# Patient Record
Sex: Male | Born: 1935 | Race: White | Hispanic: No | Marital: Married
Health system: Southern US, Community
[De-identification: ages and names within clinical notes are randomized; demographics above are authoritative.]

---

## 2006-02-18 ENCOUNTER — Emergency Department: Payer: Self-pay | Admitting: Unknown Physician Specialty

## 2006-02-23 ENCOUNTER — Inpatient Hospital Stay: Payer: Self-pay | Admitting: Internal Medicine

## 2006-02-23 ENCOUNTER — Other Ambulatory Visit: Payer: Self-pay

## 2006-03-03 ENCOUNTER — Ambulatory Visit: Payer: Self-pay | Admitting: Internal Medicine

## 2006-04-03 ENCOUNTER — Ambulatory Visit: Payer: Self-pay | Admitting: Internal Medicine

## 2007-05-03 ENCOUNTER — Ambulatory Visit: Payer: Self-pay | Admitting: Specialist

## 2007-07-23 ENCOUNTER — Ambulatory Visit: Payer: Self-pay | Admitting: General Surgery

## 2007-08-11 ENCOUNTER — Ambulatory Visit: Payer: Self-pay | Admitting: Specialist

## 2007-08-23 ENCOUNTER — Ambulatory Visit: Payer: Self-pay | Admitting: Specialist

## 2009-03-03 ENCOUNTER — Ambulatory Visit: Payer: Self-pay | Admitting: Internal Medicine

## 2009-03-06 ENCOUNTER — Ambulatory Visit: Payer: Self-pay | Admitting: Internal Medicine

## 2009-03-27 ENCOUNTER — Ambulatory Visit: Payer: Self-pay | Admitting: Specialist

## 2009-04-03 ENCOUNTER — Ambulatory Visit: Payer: Self-pay | Admitting: Internal Medicine

## 2009-05-03 ENCOUNTER — Ambulatory Visit: Payer: Self-pay | Admitting: Internal Medicine

## 2009-06-03 ENCOUNTER — Ambulatory Visit: Payer: Self-pay | Admitting: Internal Medicine

## 2009-08-03 ENCOUNTER — Ambulatory Visit: Payer: Self-pay | Admitting: Internal Medicine

## 2009-08-06 ENCOUNTER — Ambulatory Visit: Payer: Self-pay | Admitting: Internal Medicine

## 2009-08-29 ENCOUNTER — Ambulatory Visit: Payer: Self-pay | Admitting: Podiatry

## 2009-08-31 ENCOUNTER — Ambulatory Visit: Payer: Self-pay | Admitting: Podiatry

## 2009-09-03 ENCOUNTER — Ambulatory Visit: Payer: Self-pay | Admitting: Internal Medicine

## 2009-10-03 ENCOUNTER — Ambulatory Visit: Payer: Self-pay | Admitting: Internal Medicine

## 2009-11-27 ENCOUNTER — Ambulatory Visit: Payer: Self-pay | Admitting: Internal Medicine

## 2009-12-04 ENCOUNTER — Ambulatory Visit: Payer: Self-pay | Admitting: Internal Medicine

## 2010-01-01 ENCOUNTER — Ambulatory Visit: Payer: Self-pay | Admitting: Internal Medicine

## 2010-01-21 ENCOUNTER — Ambulatory Visit: Payer: Self-pay | Admitting: Internal Medicine

## 2010-01-22 ENCOUNTER — Emergency Department: Payer: Self-pay | Admitting: Emergency Medicine

## 2010-02-01 ENCOUNTER — Ambulatory Visit: Payer: Self-pay | Admitting: Internal Medicine

## 2010-03-18 ENCOUNTER — Ambulatory Visit: Payer: Self-pay | Admitting: Internal Medicine

## 2010-04-03 ENCOUNTER — Ambulatory Visit: Payer: Self-pay | Admitting: Internal Medicine

## 2010-06-26 ENCOUNTER — Ambulatory Visit: Payer: Self-pay | Admitting: Podiatry

## 2010-06-28 ENCOUNTER — Ambulatory Visit: Payer: Self-pay | Admitting: Podiatry

## 2010-07-01 LAB — PATHOLOGY REPORT

## 2010-07-04 ENCOUNTER — Ambulatory Visit: Payer: Self-pay | Admitting: Internal Medicine

## 2010-07-19 ENCOUNTER — Ambulatory Visit: Payer: Self-pay | Admitting: Internal Medicine

## 2010-08-03 ENCOUNTER — Ambulatory Visit: Payer: Self-pay | Admitting: Internal Medicine

## 2010-11-12 ENCOUNTER — Inpatient Hospital Stay: Payer: Self-pay | Admitting: Internal Medicine

## 2011-04-11 ENCOUNTER — Encounter: Payer: Self-pay | Admitting: Nurse Practitioner

## 2011-05-04 ENCOUNTER — Encounter: Payer: Self-pay | Admitting: Nurse Practitioner

## 2011-06-04 ENCOUNTER — Encounter: Payer: Self-pay | Admitting: Nurse Practitioner

## 2011-07-03 ENCOUNTER — Ambulatory Visit: Payer: Self-pay | Admitting: Rheumatology

## 2011-07-05 ENCOUNTER — Encounter: Payer: Self-pay | Admitting: Nurse Practitioner

## 2011-07-10 ENCOUNTER — Ambulatory Visit: Payer: Self-pay | Admitting: Internal Medicine

## 2011-07-15 ENCOUNTER — Ambulatory Visit: Payer: Self-pay | Admitting: Internal Medicine

## 2011-07-16 ENCOUNTER — Encounter: Payer: Self-pay | Admitting: Rheumatology

## 2011-07-30 ENCOUNTER — Encounter: Payer: Self-pay | Admitting: Nurse Practitioner

## 2011-08-04 ENCOUNTER — Ambulatory Visit: Payer: Self-pay | Admitting: Internal Medicine

## 2011-08-04 ENCOUNTER — Encounter: Payer: Self-pay | Admitting: Nurse Practitioner

## 2011-08-07 ENCOUNTER — Ambulatory Visit: Payer: Self-pay | Admitting: Internal Medicine

## 2011-08-13 ENCOUNTER — Encounter: Payer: Self-pay | Admitting: Nurse Practitioner

## 2011-08-28 ENCOUNTER — Inpatient Hospital Stay: Payer: Self-pay | Admitting: Internal Medicine

## 2011-09-04 ENCOUNTER — Ambulatory Visit: Payer: Self-pay | Admitting: Internal Medicine

## 2011-09-05 ENCOUNTER — Ambulatory Visit: Payer: Self-pay | Admitting: Internal Medicine

## 2011-10-04 ENCOUNTER — Ambulatory Visit: Payer: Self-pay | Admitting: Internal Medicine

## 2011-10-04 DEATH — deceased

## 2012-06-09 IMAGING — CT NM PET TUM IMG RESTAG (PS) SKULL BASE T - THIGH
1 of 5 series · 2 of 25 positions shown · non-contrast
Comparison: none

REASON FOR EXAM: lymphoma restaging
COMMENTS:

[Series 3: ct headneck 2.0 b31s · axial · 2.0mm · 0.98mm/px · z∈[-486,-450]mm · 2 of 188 slices shown]
[im 164/188  brain]
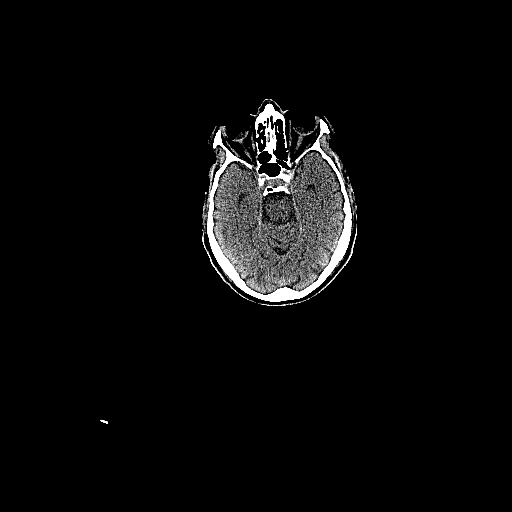
[im 188/188  brain]
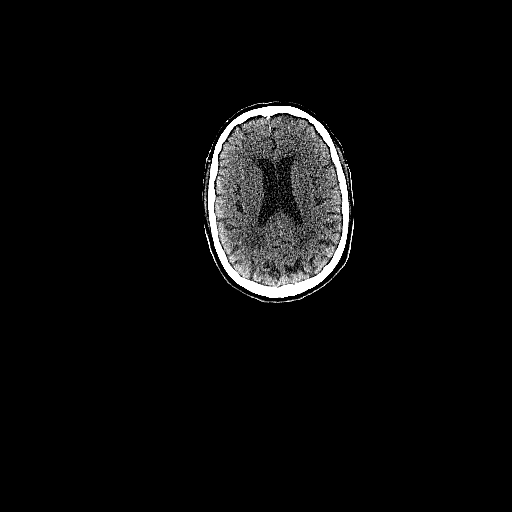

[2 of 25 positions shown; findings below may reference images not displayed]

PROCEDURE:     PET - PET/CT RESTAGING LYMPHOMA  - July 15, 2011  [DATE]

RESULT:     The patient is undergoing restaging of lymphoma. The patient's
fasting blood dose glucose level was 137 mg per deciliter. The patient
received 11.7 mCi of F-18 labeled FDG at [DATE] p.m. with scanning beginning
at [DATE] p.m. Delayed imaging was begun at 4551 hours. A noncontrast CT scan
was performed at the same sitting for coregistration and attenuation
correction. The patient had difficulty remaining motionless during the study.

Uptake of the radiopharmaceutical over the neck is within the limits of
normal. Within the thorax there is normal expected uptake within the
myocardium. There is low level increased uptake in the right hilum with SUV
of 3.4 with a mean of 3. I do not see abnormal uptake in the left hilum nor
in the mediastinum. No abnormal uptake in the axillary regions demonstrate a
few

Within the abdomen and pelvis uptake within the liver and spleen is within
the limits of normal. The spleen is enlarged. There is a focus of increased
uptake which lies just anterior to the right kidney and corresponds to the
wall of the ascending colon demonstrated best on the CT images to 258- 272.
This exhibits an SUV 5.8 maximally and 4.2 as a mean. Normal expected uptake
within the kidneys and urinary bladder.  I do not see abnormal uptake in the
inguinal regions nor the periaortic or pericaval regions nor in the adrenal
glands are

The CT images reveal no bulky cervical lymphadenopathy. The parotid and
submandibular glands appear normal. The thyroid lobes are normal in density
and symmetric in size. There are numerous lymph nodes within the mediastinum
and hilar regions some of which are borderline enlarged, but these do not
exhibit significantly increased uptake outside of the one mentioned in the
right hilum. I see no pleural nor pericardial effusion. The cardiac chambers
are enlarged. The stomach is partially intrathoracic.

Within the abdomen the spleen measures 19.6 cm AP. The liver exhibits
several well-circumscribed hypodensities most compatible with cysts. A large
calcified gallstone is present. There are no adrenal masses. The kidneys
exhibit no evidence of obstruction. There are are numerous normal to
slightly enlarged retroperitoneal and periaortic and pericaval lymph nodes
without significant abnormal uptake. The prostate gland is enlarged and the
patient may have undergone previous TURP. I do not see abnormal skeletal
uptake.

On the delayed images uptake of the radiopharmaceutical within the neck is
within the limits of normal.
IMPRESSION: 1. There is a small amount of increased uptake in the right hilum as
described. There are other borderline to mildly enlarged mediastinal and
hilar lymph nodes which do not exhibit increased uptake.
2. There is a focus of fairly intensely increased uptake associated with a
area of increased soft tissue density anterior to the lower pole of the
right kidney. This corresponds to an area of bowel and may be physiologic
and will merit followup.
3. There is splenomegaly. There is at least one large gallstone present.
There is a large hiatal hernia-partially intrathoracic stomach.

## 2015-06-04 ENCOUNTER — Encounter: Payer: Self-pay | Admitting: Nurse Practitioner

## 2015-06-04 NOTE — Progress Notes (Signed)
This encounter was created in error - please disregard.
